# Patient Record
Sex: Female | Born: 1945
Health system: Southern US, Community
[De-identification: ages and names within clinical notes are randomized; demographics above are authoritative.]

## PROBLEM LIST (undated history)

## (undated) DIAGNOSIS — E079 Disorder of thyroid, unspecified: Secondary | ICD-10-CM

## (undated) DIAGNOSIS — K219 Gastro-esophageal reflux disease without esophagitis: Secondary | ICD-10-CM

## (undated) DIAGNOSIS — I10 Essential (primary) hypertension: Secondary | ICD-10-CM

## (undated) HISTORY — DX: Gastro-esophageal reflux disease without esophagitis: K21.9

## (undated) HISTORY — DX: Disorder of thyroid, unspecified: E07.9

## (undated) HISTORY — PX: DILATION AND CURETTAGE OF UTERUS: SHX78

## (undated) HISTORY — DX: Essential (primary) hypertension: I10

---

## 2015-01-29 ENCOUNTER — Ambulatory Visit (INDEPENDENT_AMBULATORY_CARE_PROVIDER_SITE_OTHER): Payer: Medicare Other | Admitting: Internal Medicine

## 2015-01-29 ENCOUNTER — Encounter: Payer: Self-pay | Admitting: Internal Medicine

## 2015-01-29 ENCOUNTER — Encounter: Payer: Self-pay | Admitting: *Deleted

## 2015-01-29 VITALS — BP 133/75 | HR 82 | Resp 16 | Ht 61.0 in | Wt 168.0 lb

## 2015-01-29 DIAGNOSIS — I10 Essential (primary) hypertension: Secondary | ICD-10-CM

## 2015-01-29 DIAGNOSIS — R011 Cardiac murmur, unspecified: Secondary | ICD-10-CM | POA: Insufficient documentation

## 2015-01-29 DIAGNOSIS — E038 Other specified hypothyroidism: Secondary | ICD-10-CM

## 2015-01-29 DIAGNOSIS — N6019 Diffuse cystic mastopathy of unspecified breast: Secondary | ICD-10-CM | POA: Insufficient documentation

## 2015-01-29 DIAGNOSIS — E039 Hypothyroidism, unspecified: Secondary | ICD-10-CM | POA: Insufficient documentation

## 2015-01-29 LAB — CBC WITH DIFFERENTIAL/PLATELET
BASOS PCT: 0 % (ref 0–1)
Basophils Absolute: 0 10*3/uL (ref 0.0–0.1)
EOS ABS: 0.1 10*3/uL (ref 0.0–0.7)
Eosinophils Relative: 1 % (ref 0–5)
HCT: 40.9 % (ref 36.0–46.0)
HEMOGLOBIN: 13.6 g/dL (ref 12.0–15.0)
LYMPHS PCT: 28 % (ref 12–46)
Lymphs Abs: 2.4 10*3/uL (ref 0.7–4.0)
MCH: 30 pg (ref 26.0–34.0)
MCHC: 33.3 g/dL (ref 30.0–36.0)
MCV: 90.3 fL (ref 78.0–100.0)
MONOS PCT: 9 % (ref 3–12)
MPV: 8.9 fL (ref 8.6–12.4)
Monocytes Absolute: 0.8 10*3/uL (ref 0.1–1.0)
NEUTROS ABS: 5.3 10*3/uL (ref 1.7–7.7)
NEUTROS PCT: 62 % (ref 43–77)
Platelets: 304 10*3/uL (ref 150–400)
RBC: 4.53 MIL/uL (ref 3.87–5.11)
RDW: 13.5 % (ref 11.5–15.5)
WBC: 8.5 10*3/uL (ref 4.0–10.5)

## 2015-01-29 LAB — COMPREHENSIVE METABOLIC PANEL
ALK PHOS: 75 U/L (ref 39–117)
ALT: 20 U/L (ref 0–35)
AST: 24 U/L (ref 0–37)
Albumin: 4.2 g/dL (ref 3.5–5.2)
BUN: 8 mg/dL (ref 6–23)
CO2: 26 mEq/L (ref 19–32)
CREATININE: 0.59 mg/dL (ref 0.50–1.10)
Calcium: 9.4 mg/dL (ref 8.4–10.5)
Chloride: 102 mEq/L (ref 96–112)
Glucose, Bld: 87 mg/dL (ref 70–99)
POTASSIUM: 4.3 meq/L (ref 3.5–5.3)
Sodium: 140 mEq/L (ref 135–145)
Total Bilirubin: 0.4 mg/dL (ref 0.2–1.2)
Total Protein: 6.8 g/dL (ref 6.0–8.3)

## 2015-01-29 LAB — LIPID PANEL
CHOL/HDL RATIO: 4 ratio
CHOLESTEROL: 177 mg/dL (ref 0–200)
HDL: 44 mg/dL (ref >39)
LDL Cholesterol: 115 mg/dL — ABNORMAL HIGH (ref 0–99)
Triglycerides: 92 mg/dL (ref <150)
VLDL: 18 mg/dL (ref 0–40)

## 2015-01-29 NOTE — Progress Notes (Addendum)
   Subjective:    Patient ID: Tracy Barber, female    DOB: 1946-08-25, 69 y.o.   MRN: [id number]  HPI New pt here for first visit  PMH of  HTN, GERD,  Heart murmur  (Dr. Judithe ModestFolk),  Hypothyroidism  (Dr. Allena KatzPatel) and Fibrocystic breast disease.    Doing well   Has occasional palpitations controlled with beta blocker   Allergies not on file No past medical history on file. No past surgical history on file. History   Social History  . Marital Status: N/A    Spouse Name: N/A    Number of Children: N/A  . Years of Education: N/A   Occupational History  . Not on file.   Social History Main Topics  . Smoking status: Not on file  . Smokeless tobacco: Not on file  . Alcohol Use: Not on file  . Drug Use: Not on file  . Sexual Activity: Not on file   Other Topics Concern  . Not on file   Social History Narrative  . No narrative on file   No family history on file. There are no active problems to display for this patient.  No current outpatient prescriptions on file prior to visit.   No current facility-administered medications on file prior to visit.       Review of Systems See HPI    Objective:   Physical Exam Physical Exam  Nursing note and vitals reviewed.  Constitutional: She is oriented to person, place, and time. She appears well-developed and well-nourished.  HENT:  Head: Normocephalic and atraumatic.  Cardiovascular: Normal rate and regular rhythm. Exam reveals no gallop and no friction rub.  2/6  SEM    Pulmonary/Chest: Breath sounds normal. She has no wheezes. She has no rales.  Neurological: She is alert and oriented to person, place, and time.  Skin: Skin is warm and dry.  No edema  Psychiatric: She has a normal mood and affect. Her behavior is normal.         Assessment & Plan:  HTN continue beta blocker  Will check all labs today  GERD continue omeprazole  hypothyrodism  Continue meds  fiborcystic breast disease  Check all labs   Schedule CPE  Addendum  3/1  Review of old record  PMH of renal calculus,  Diverticulosis and was referred to GYN for perineal lesion R side.  Seen 08/2014  By FNP Deneen Hartselizabeth [facility]

## 2015-01-30 ENCOUNTER — Encounter: Payer: Self-pay | Admitting: *Deleted

## 2015-01-30 LAB — VITAMIN D 25 HYDROXY (VIT D DEFICIENCY, FRACTURES): Vit D, 25-Hydroxy: 48 ng/mL (ref 30–100)

## 2015-01-30 LAB — TSH: TSH: 0.555 u[IU]/mL (ref 0.350–4.500)

## 2015-02-25 ENCOUNTER — Encounter: Payer: Self-pay | Admitting: Internal Medicine

## 2015-02-25 ENCOUNTER — Encounter: Payer: Self-pay | Admitting: *Deleted

## 2015-02-25 DIAGNOSIS — K5730 Diverticulosis of large intestine without perforation or abscess without bleeding: Secondary | ICD-10-CM | POA: Insufficient documentation

## 2015-02-25 DIAGNOSIS — N200 Calculus of kidney: Secondary | ICD-10-CM | POA: Insufficient documentation

## 2015-06-04 ENCOUNTER — Encounter: Payer: Medicare Other | Admitting: Internal Medicine

## 2016-09-04 ENCOUNTER — Emergency Department (HOSPITAL_BASED_OUTPATIENT_CLINIC_OR_DEPARTMENT_OTHER)
Admission: EM | Admit: 2016-09-04 | Discharge: 2016-09-04 | Disposition: A | Discharge status: Home / Self Care | Payer: Medicare Other | Attending: Emergency Medicine | Admitting: Emergency Medicine

## 2016-09-04 ENCOUNTER — Encounter (HOSPITAL_BASED_OUTPATIENT_CLINIC_OR_DEPARTMENT_OTHER): Payer: Self-pay | Admitting: Emergency Medicine

## 2016-09-04 DIAGNOSIS — Z23 Encounter for immunization: Secondary | ICD-10-CM | POA: Diagnosis not present

## 2016-09-04 DIAGNOSIS — S61412A Laceration without foreign body of left hand, initial encounter: Secondary | ICD-10-CM | POA: Insufficient documentation

## 2016-09-04 DIAGNOSIS — W260XXA Contact with knife, initial encounter: Secondary | ICD-10-CM | POA: Diagnosis not present

## 2016-09-04 DIAGNOSIS — Y999 Unspecified external cause status: Secondary | ICD-10-CM | POA: Insufficient documentation

## 2016-09-04 DIAGNOSIS — Z79899 Other long term (current) drug therapy: Secondary | ICD-10-CM | POA: Insufficient documentation

## 2016-09-04 DIAGNOSIS — I10 Essential (primary) hypertension: Secondary | ICD-10-CM | POA: Insufficient documentation

## 2016-09-04 DIAGNOSIS — Y939 Activity, unspecified: Secondary | ICD-10-CM | POA: Insufficient documentation

## 2016-09-04 DIAGNOSIS — Y929 Unspecified place or not applicable: Secondary | ICD-10-CM | POA: Diagnosis not present

## 2016-09-04 MED ORDER — TETANUS-DIPHTH-ACELL PERTUSSIS 5-2.5-18.5 LF-MCG/0.5 IM SUSP
0.5000 mL | Freq: Once | INTRAMUSCULAR | Status: AC
  Administered 2016-09-04: 0.5 mL via INTRAMUSCULAR
  Filled 2016-09-04: qty 0.5

## 2016-09-04 NOTE — ED Triage Notes (Signed)
Patient states she was slicing an avocado and cut the palm of her hand.

## 2016-09-04 NOTE — ED Provider Notes (Signed)
[facility]   CSN: [id number] Arrival date & time: 09/04/16  1106     History   Chief Complaint Chief Complaint  Patient presents with  . Laceration    left hand    HPI Tracy Barber is a 70 y.o. female.  The history is provided by the patient. No language interpreter was used.  Laceration   The incident occurred less than 1 hour ago. The laceration is located on the left hand. The laceration is 1 cm in size. The laceration mechanism was a a clean knife. The pain is mild. The pain has been constant since onset. She reports no foreign bodies present. Her tetanus status is out of date.  Pt cut finger with a knife trying to cut an avacado.  Pt reports she stabbed left hand with knife  Past Medical History:  Diagnosis Date  . GERD (gastroesophageal reflux disease)   . Hypertension   . Thyroid disease     Patient Active Problem List   Diagnosis Date Noted  . Calculus of kidney 02/25/2015  . Diverticulosis of colon without hemorrhage 02/25/2015  . Fibrocystic breast disease 01/29/2015  . Hypothyroidism 01/29/2015  . Heart murmur 01/29/2015    Past Surgical History:  Procedure Laterality Date  . CESAREAN SECTION    . DILATION AND CURETTAGE OF UTERUS      OB History    No data available       Home Medications    Prior to Admission medications   Medication Sig Start Date End Date Taking? Authorizing Provider  levothyroxine (SYNTHROID, LEVOTHROID) 100 MCG tablet  01/27/15  Yes Historical Provider, MD  metoprolol succinate (TOPROL-XL) 50 MG 24 hr tablet  11/29/14  Yes Historical Provider, MD  omeprazole (PRILOSEC) 40 MG capsule  01/16/15  Yes Historical Provider, MD    Family History Family History  Problem Relation Age of Onset  . COPD Mother   . Stroke Father   . Heart disease Father   . Hypertension Father   . Thyroid disease Sister   . Hypertension Sister   . Colon cancer Paternal Aunt   . Breast cancer Cousin     Social  History Social History  Substance Use Topics  . Smoking status: Never Smoker  . Smokeless tobacco: Never Used  . Alcohol use 0.0 oz/week     Comment: rare     Allergies   Biaxin [clarithromycin] and Codeine   Review of Systems Review of Systems  All other systems reviewed and are negative.    Physical Exam Updated Vital Signs BP 172/92 (BP Location: Right Arm)   Pulse 88   Temp 98.5 F (36.9 C) (Oral)   Resp 18   Ht 5\' 2"  (1.575 m)   Wt 72.6 kg   SpO2 100%   BMI 29.26 kg/m   Physical Exam  Constitutional: She appears well-developed and well-nourished. No distress.  HENT:  Head: Normocephalic and atraumatic.  Eyes: Conjunctivae are normal.  Cardiovascular: Normal rate.   No murmur heard. Pulmonary/Chest: Effort normal. No respiratory distress.  Abdominal: Soft. There is no tenderness.  Musculoskeletal: She exhibits no edema.  1cm laceration palmar aspect of left hand,  No gapping,  Minimal penetration,  nv and ns intact  Neurological: She is alert.  Skin: Skin is warm and dry.  Psychiatric: She has a normal mood and affect.  Nursing note and vitals reviewed.    ED Treatments / Results  Labs (all labs ordered are listed, but only abnormal  results are displayed) Labs Reviewed - No data to display  EKG  EKG Interpretation None       Radiology No results found.  Procedures Procedures (including critical care time)  Medications Ordered in ED Medications  Tdap (BOOSTRIX) injection 0.5 mL (not administered)     Initial Impression / Assessment and Plan / ED Course  I have reviewed the triage vital signs and the nursing notes.  Pertinent labs & imaging results that were available during my care of the patient were reviewed by me and considered in my medical decision making (see chart for details).  Clinical Course    Wound is superficial,   Steri strips to close area,  Tetanus given bandage.  Final Clinical Impressions(s) / ED Diagnoses    Final diagnoses:  Laceration of hand, left, initial encounter    New Prescriptions New Prescriptions   No medications on file  An After Visit Summary was printed and given to the patient.   Lonia Skinner [place], PA-C 09/04/16 1140    Jacalyn Lefevre, MD 09/04/16 [phone]

## 2016-09-04 NOTE — ED Notes (Signed)
Patient stabbed herself in left palm while pitting an avocado approximately 1.5 hrs ago.  Gauze wrap removed by PA for inspection.  Not actively bleeding and patient demonstrated full movement of her hand.

## 2017-12-19 ENCOUNTER — Encounter (HOSPITAL_BASED_OUTPATIENT_CLINIC_OR_DEPARTMENT_OTHER): Payer: Self-pay | Admitting: Emergency Medicine

## 2017-12-19 ENCOUNTER — Emergency Department (HOSPITAL_BASED_OUTPATIENT_CLINIC_OR_DEPARTMENT_OTHER): Payer: Medicare Other

## 2017-12-19 ENCOUNTER — Other Ambulatory Visit: Payer: Self-pay

## 2017-12-19 ENCOUNTER — Emergency Department (HOSPITAL_BASED_OUTPATIENT_CLINIC_OR_DEPARTMENT_OTHER)
Admission: EM | Admit: 2017-12-19 | Discharge: 2017-12-19 | Disposition: A | Discharge status: Home / Self Care | Payer: Medicare Other | Attending: Emergency Medicine | Admitting: Emergency Medicine

## 2017-12-19 DIAGNOSIS — I10 Essential (primary) hypertension: Secondary | ICD-10-CM | POA: Diagnosis not present

## 2017-12-19 DIAGNOSIS — Z79899 Other long term (current) drug therapy: Secondary | ICD-10-CM | POA: Insufficient documentation

## 2017-12-19 DIAGNOSIS — M79602 Pain in left arm: Secondary | ICD-10-CM | POA: Insufficient documentation

## 2017-12-19 DIAGNOSIS — M25512 Pain in left shoulder: Secondary | ICD-10-CM | POA: Insufficient documentation

## 2017-12-19 DIAGNOSIS — R011 Cardiac murmur, unspecified: Secondary | ICD-10-CM | POA: Diagnosis not present

## 2017-12-19 LAB — BASIC METABOLIC PANEL
Anion gap: 6 (ref 5–15)
BUN: 9 mg/dL (ref 6–20)
CHLORIDE: 107 mmol/L (ref 101–111)
CO2: 24 mmol/L (ref 22–32)
CREATININE: 0.6 mg/dL (ref 0.44–1.00)
Calcium: 8.8 mg/dL — ABNORMAL LOW (ref 8.9–10.3)
Glucose, Bld: 120 mg/dL — ABNORMAL HIGH (ref 65–99)
POTASSIUM: 3.6 mmol/L (ref 3.5–5.1)
SODIUM: 137 mmol/L (ref 135–145)

## 2017-12-19 LAB — CBC WITH DIFFERENTIAL/PLATELET
BASOS ABS: 0 10*3/uL (ref 0.0–0.1)
BASOS PCT: 0 %
EOS ABS: 0 10*3/uL (ref 0.0–0.7)
Eosinophils Relative: 1 %
HEMATOCRIT: 42.6 % (ref 36.0–46.0)
HEMOGLOBIN: 14.2 g/dL (ref 12.0–15.0)
Lymphocytes Relative: 24 %
Lymphs Abs: 1.7 10*3/uL (ref 0.7–4.0)
MCH: 29.6 pg (ref 26.0–34.0)
MCHC: 33.3 g/dL (ref 30.0–36.0)
MCV: 88.9 fL (ref 78.0–100.0)
Monocytes Absolute: 0.6 10*3/uL (ref 0.1–1.0)
Monocytes Relative: 9 %
NEUTROS ABS: 4.6 10*3/uL (ref 1.7–7.7)
NEUTROS PCT: 66 %
Platelets: 286 10*3/uL (ref 150–400)
RBC: 4.79 MIL/uL (ref 3.87–5.11)
RDW: 13.3 % (ref 11.5–15.5)
WBC: 7 10*3/uL (ref 4.0–10.5)

## 2017-12-19 LAB — TROPONIN I

## 2017-12-19 MED ORDER — PROMETHAZINE HCL 25 MG PO TABS: 25.0000 mg | ORAL_TABLET | Freq: Four times a day (QID) | ORAL | 0 refills | Status: AC | PRN

## 2017-12-19 MED ORDER — HYDROCODONE-ACETAMINOPHEN 5-325 MG PO TABS
1.0000 | ORAL_TABLET | Freq: Once | ORAL | Status: DC
  Filled 2017-12-19: qty 1

## 2017-12-19 MED ORDER — HYDROCODONE-ACETAMINOPHEN 5-325 MG PO TABS: 1.0000 | ORAL_TABLET | Freq: Four times a day (QID) | ORAL | 0 refills | Status: AC | PRN

## 2017-12-19 MED FILL — HYDROCODON-APAP 5-325: 5-325 | 2 days supply | Qty: 6 | Fill #0

## 2017-12-19 MED FILL — PROMETHAZINE 25 MG TABLET: 25 | 8 days supply | Qty: 30 | Fill #0

## 2017-12-19 NOTE — ED Notes (Signed)
Patient transported to X-ray 

## 2017-12-19 NOTE — Discharge Instructions (Signed)
Your heart work-up today is reassuring. Your x-rays of the shoulder and chest are normal. This may be more muscular pain or strain. Continue ibuprofen/motrin. Norco is prescribed for break through pain for the next 1-2 days. Please follow-up with your PCP  Return for worsening symptoms, including difficulty breathing, escalating pain, passing out, extreme fatigue or any other symptoms concerning to you

## 2017-12-19 NOTE — ED Provider Notes (Signed)
[facility] EMERGENCY DEPARTMENT Provider Note   CSN: [id number] Arrival date & time: 12/19/17  1013     History   Chief Complaint Chief Complaint  Patient presents with  . Arm Pain    HPI Tracy Barber is a 71 y.o. female.  HPI 71 year old female who presents with left arm pain.  She has a history of hypertension and hypothyroidism.  Reports several days of constant left upper arm, left shoulder blade, and left shoulder pain.  Her symptoms have been ongoing for 1 week, constant, unremitting.  Symptoms sometimes worse with range of motion of the left shoulder.  Not associated with activity or exertion.  Denies associating shortness of breath, nausea or vomiting or diaphoresis, syncope or near syncope.  Does report doing yoga on a routine basis but does not recall a specific injury.  Also states that one week ago she was shoveling snow.  Has taking 800 mg of ibuprofen occasionally without significant improvement in pain.  Denies arm swelling or leg swelling.  She reports that her friend who is a Publishing rights managernurse practitioner told her to come to the ED to be ruled out for heart attack as she was told that women present atypically with heart problems.   Past Medical History:  Diagnosis Date  . GERD (gastroesophageal reflux disease)   . Hypertension   . Thyroid disease     Patient Active Problem List   Diagnosis Date Noted  . Calculus of kidney 02/25/2015  . Diverticulosis of colon without hemorrhage 02/25/2015  . Fibrocystic breast disease 01/29/2015  . Hypothyroidism 01/29/2015  . Heart murmur 01/29/2015    Past Surgical History:  Procedure Laterality Date  . CESAREAN SECTION    . DILATION AND CURETTAGE OF UTERUS      OB History    No data available       Home Medications    Prior to Admission medications   Medication Sig Start Date End Date Taking? Authorizing Provider  HYDROcodone-acetaminophen (NORCO/VICODIN) 5-325 MG tablet Take 1 tablet by mouth every 6  (six) hours as needed for severe pain. 12/19/17   Lavera GuiseLiu, Tabytha Gradillas Duo, MD  levothyroxine Erline Levine(SYNTHROID, LEVOTHROID) 100 MCG tablet  01/27/15   [provider]  metoprolol succinate (TOPROL-XL) 50 MG 24 hr tablet  11/29/14   [provider]  omeprazole (PRILOSEC) 40 MG capsule  01/16/15   [provider]  promethazine (PHENERGAN) 25 MG tablet Take 1 tablet (25 mg total) by mouth every 6 (six) hours as needed for nausea or vomiting. 12/19/17   Lavera GuiseLiu, Didier Brandenburg Duo, MD    Family History Family History  Problem Relation Age of Onset  . COPD Mother   . Stroke Father   . Heart disease Father   . Hypertension Father   . Thyroid disease Sister   . Hypertension Sister   . Colon cancer Paternal Aunt   . Breast cancer Cousin     Social History Social History   Tobacco Use  . Smoking status: Never Smoker  . Smokeless tobacco: Never Used  Substance Use Topics  . Alcohol use: Yes    Alcohol/week: 0.0 oz    Comment: rare  . Drug use: No     Allergies   Biaxin [clarithromycin] and Codeine   Review of Systems Review of Systems  Constitutional: Negative for fever.  Respiratory: Negative for shortness of breath.   Cardiovascular: Negative for chest pain.  Musculoskeletal:       Left arm shoulder pain   Allergic/Immunologic: Negative  for immunocompromised state.  Hematological: Does not bruise/bleed easily.  All other systems reviewed and are negative.    Physical Exam Updated Vital Signs BP (!) 187/82 (BP Location: Right Arm)   Pulse (!) 102   Temp 98.4 F (36.9 C) (Oral)   Resp 18   SpO2 99%   Physical Exam Physical Exam  Nursing note and vitals reviewed. Constitutional: Well developed, well nourished, non-toxic, and in no acute distress Head: Normocephalic and atraumatic.  Mouth/Throat: Oropharynx is clear and moist.  Neck: Normal range of motion. Neck supple.  Cardiovascular: Normal rate and regular rhythm.  +2 DP pulses  Pulmonary/Chest: Effort normal and  breath sounds normal.  Abdominal: Soft. There is no tenderness. There is no rebound and no guarding.  Musculoskeletal: Normal range of motion, with some pain when shoulder is abducted.  No muscular tenderness to palpation.  Neurological: Alert, no facial droop, fluent speech, moves all extremities symmetrically, full strength and sensation bilateral upper extremities Skin: Skin is warm and dry.  Psychiatric: Cooperative   ED Treatments / Results  Labs (all labs ordered are listed, but only abnormal results are displayed) Labs Reviewed  BASIC METABOLIC PANEL - Abnormal; Notable for the following components:      Result Value   Glucose, Bld 120 (*)    Calcium 8.8 (*)    All other components within normal limits  CBC WITH DIFFERENTIAL/PLATELET  TROPONIN I    EKG  EKG Interpretation  Date/Time:  Monday December 19 2017 10:53:52 EST Ventricular Rate:  85 PR Interval:    QRS Duration: 105 QT Interval:  379 QTC Calculation: 451 R Axis:   31 Text Interpretation:  Sinus rhythm no ischemic findings no prior EKG  Confirmed by Crista Curb [phone]) on 12/19/2017 11:28:14 AM       Radiology Dg Chest 2 View  Result Date: 12/19/2017 CLINICAL DATA:  Left upper extremity throbbing for several days. EXAM: CHEST  2 VIEW COMPARISON:  None FINDINGS: Mild atelectasis or scar in the left lung base. No pneumothorax. The heart, hila, mediastinum, lungs, and pleura are otherwise normal. IMPRESSION: No active cardiopulmonary disease. Electronically Signed   By: Gerome Sam III M.D   On: 12/19/2017 10:49   Dg Shoulder Left  Result Date: 12/19/2017 CLINICAL DATA:  Pain. EXAM: LEFT SHOULDER - 2+ VIEW COMPARISON:  None. FINDINGS: There is no evidence of fracture or dislocation. There is no evidence of arthropathy or other focal bone abnormality. Soft tissues are unremarkable. IMPRESSION: Negative. Electronically Signed   By: Gerome Sam III M.D   On: 12/19/2017 10:50    Procedures Procedures  (including critical care time)  Medications Ordered in ED Medications  HYDROcodone-acetaminophen (NORCO/VICODIN) 5-325 MG per tablet 1 tablet (1 tablet Oral Refused 12/19/17 1054)     Initial Impression / Assessment and Plan / ED Course  I have reviewed the triage vital signs and the nursing notes.  Pertinent labs & imaging results that were available during my care of the patient were reviewed by me and considered in my medical decision making (see chart for details).     71 year old female who presents with left arm pain.  She is nontoxic in no acute distress with stable vital signs.  Ongoing symptoms for 1 week now, constant.  Very atypical for ACS.  Somewhat reproduced with range of motion of the left shoulder.  Suspect likely more muscular strain or musculoskeletal pain.  Doubt serious processes such as dissection.  Extremity is neurovascularly intact.  Given her  concerns for cardiac etiology she did undergo blood work x-ray imaging and EKG.  Her EKG shows no acute ischemic changes.  X-rays of the shoulder and chest are visualized and shows no acute processes.  Blood work including troponin is normal.  She will be discharged home with continued supportive care instructions and follow-up with PCP. Strict return and follow-up instructions reviewed. She expressed understanding of all discharge instructions and felt comfortable with the plan of care.   Final Clinical Impressions(s) / ED Diagnoses   Final diagnoses:  Left arm pain    ED Discharge Orders        Ordered    HYDROcodone-acetaminophen (NORCO/VICODIN) 5-325 MG tablet  Every 6 hours PRN     12/19/17 1136    promethazine (PHENERGAN) 25 MG tablet  Every 6 hours PRN     12/19/17 1136       Lavera GuiseLiu, Antavion Bartoszek Duo, MD 12/19/17 1140

## 2017-12-19 NOTE — ED Triage Notes (Signed)
Pt c/o LUE "throbbing" for several days; c/o back pain and "shoulder blade" pain x 1 wk; thinks could be r/t doing yoga

## 2019-01-16 IMAGING — CR DG SHOULDER 2+V*L*
4 series · 4 of 4 positions shown · non-contrast
Comparison: None.

CLINICAL DATA: Pain.

EXAM:
LEFT SHOULDER - 2+ VIEW

[w shoulder grashey left (1 of 2)]
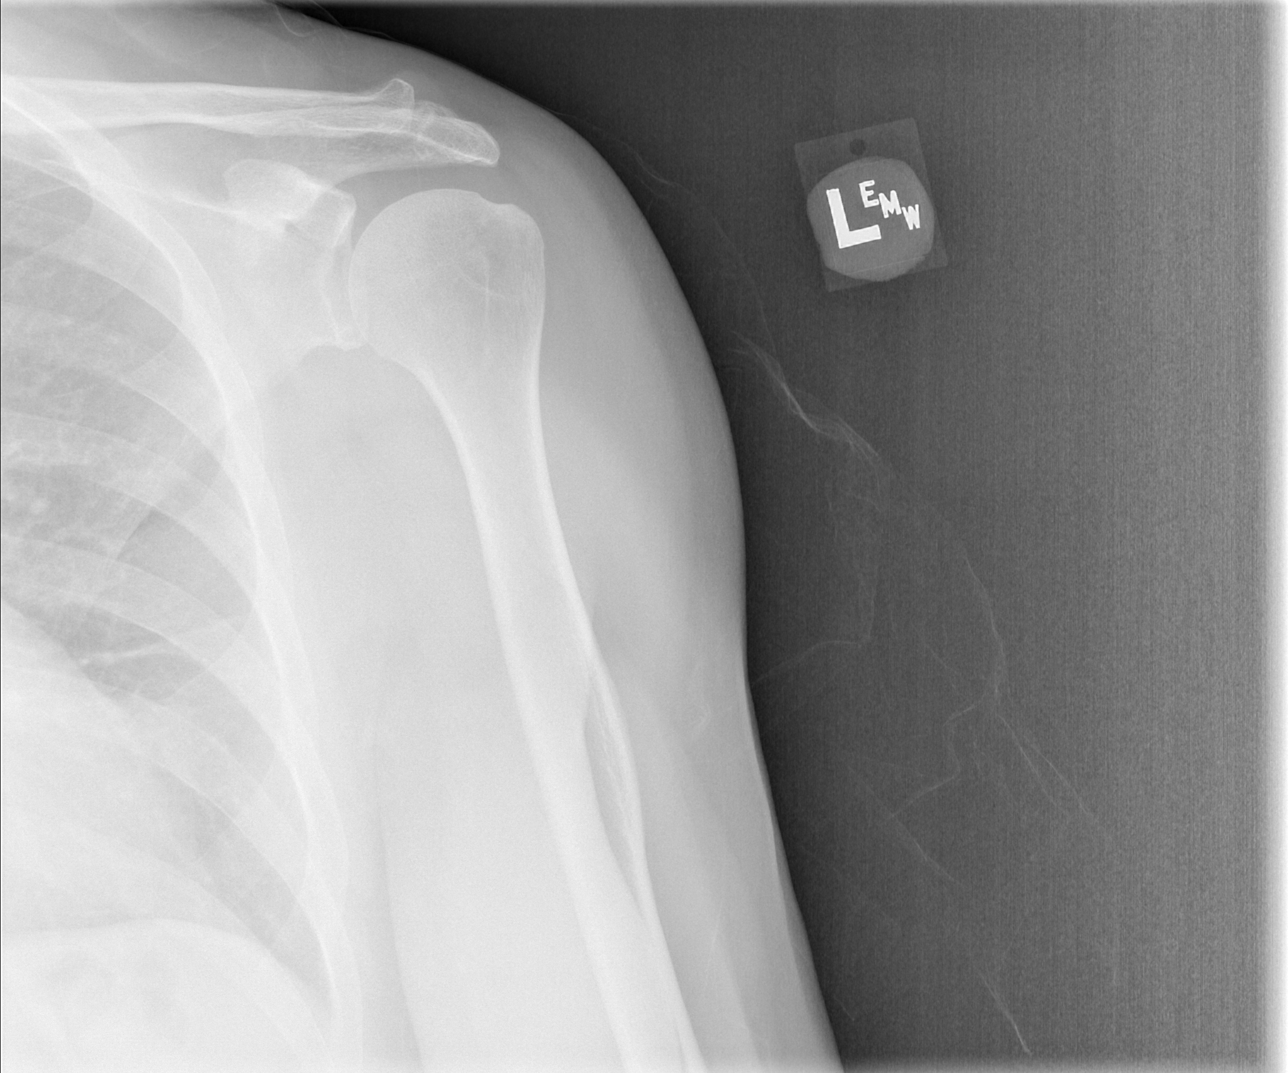

[w shoulder grashey left (2 of 2)]
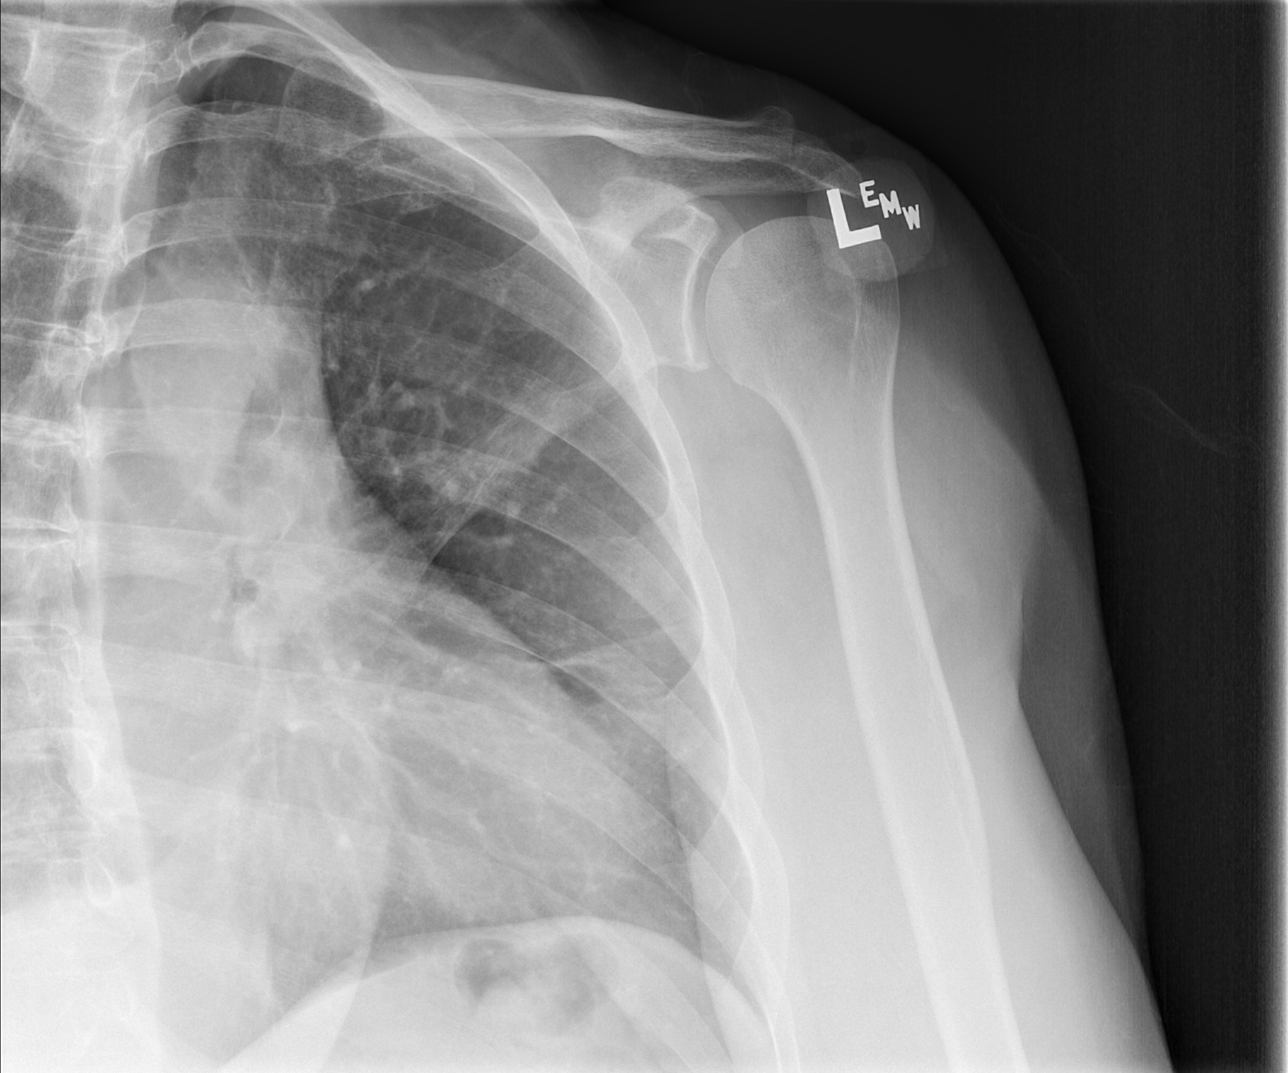

[w shoulder y view left]
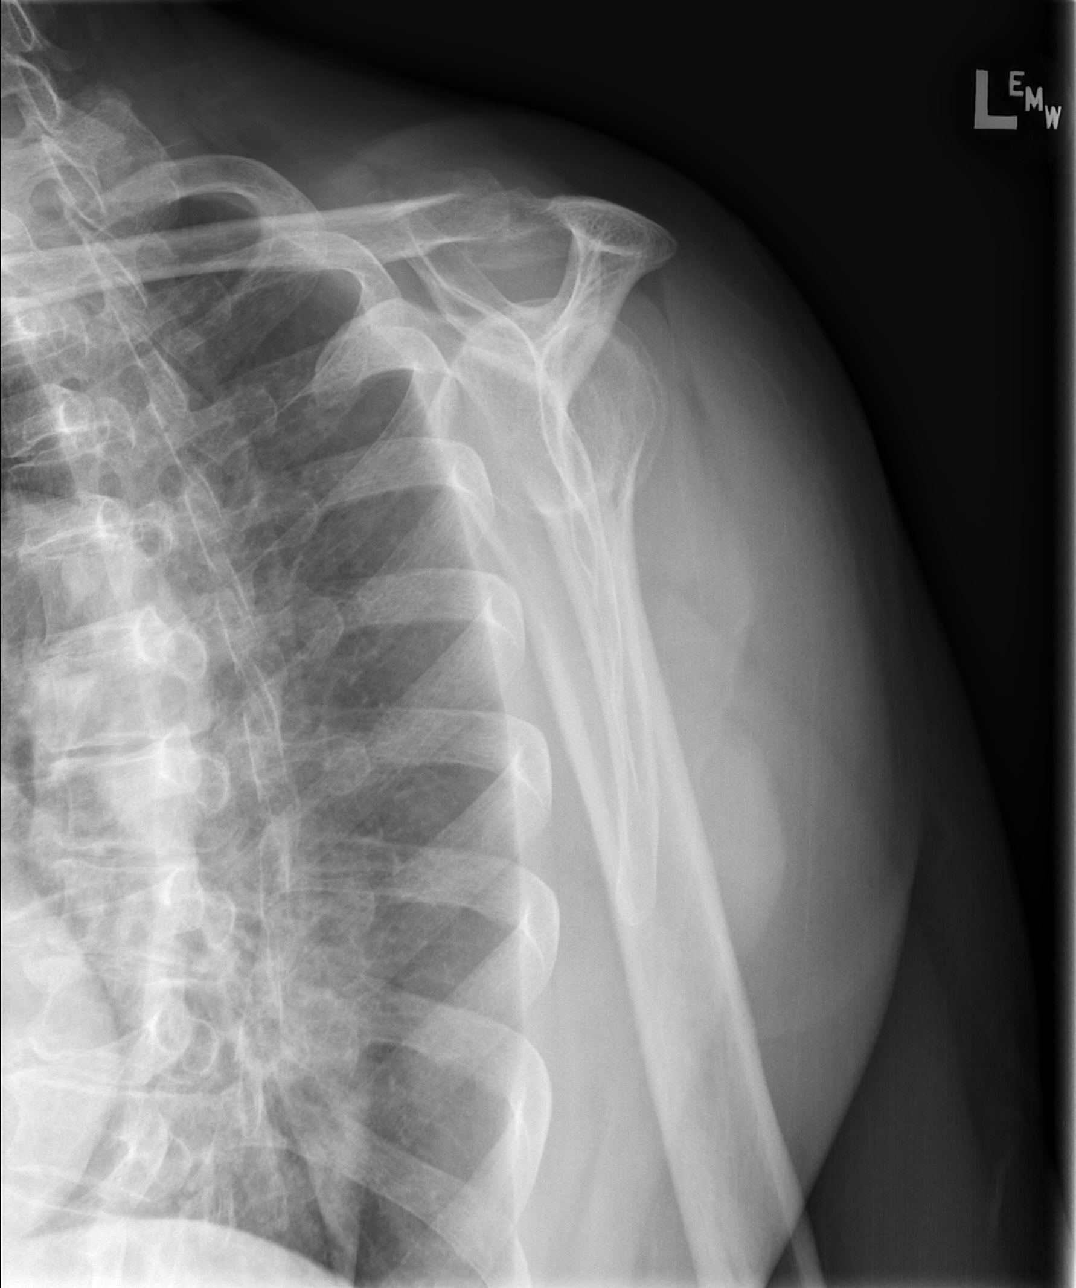

[x shoulder axillary left]
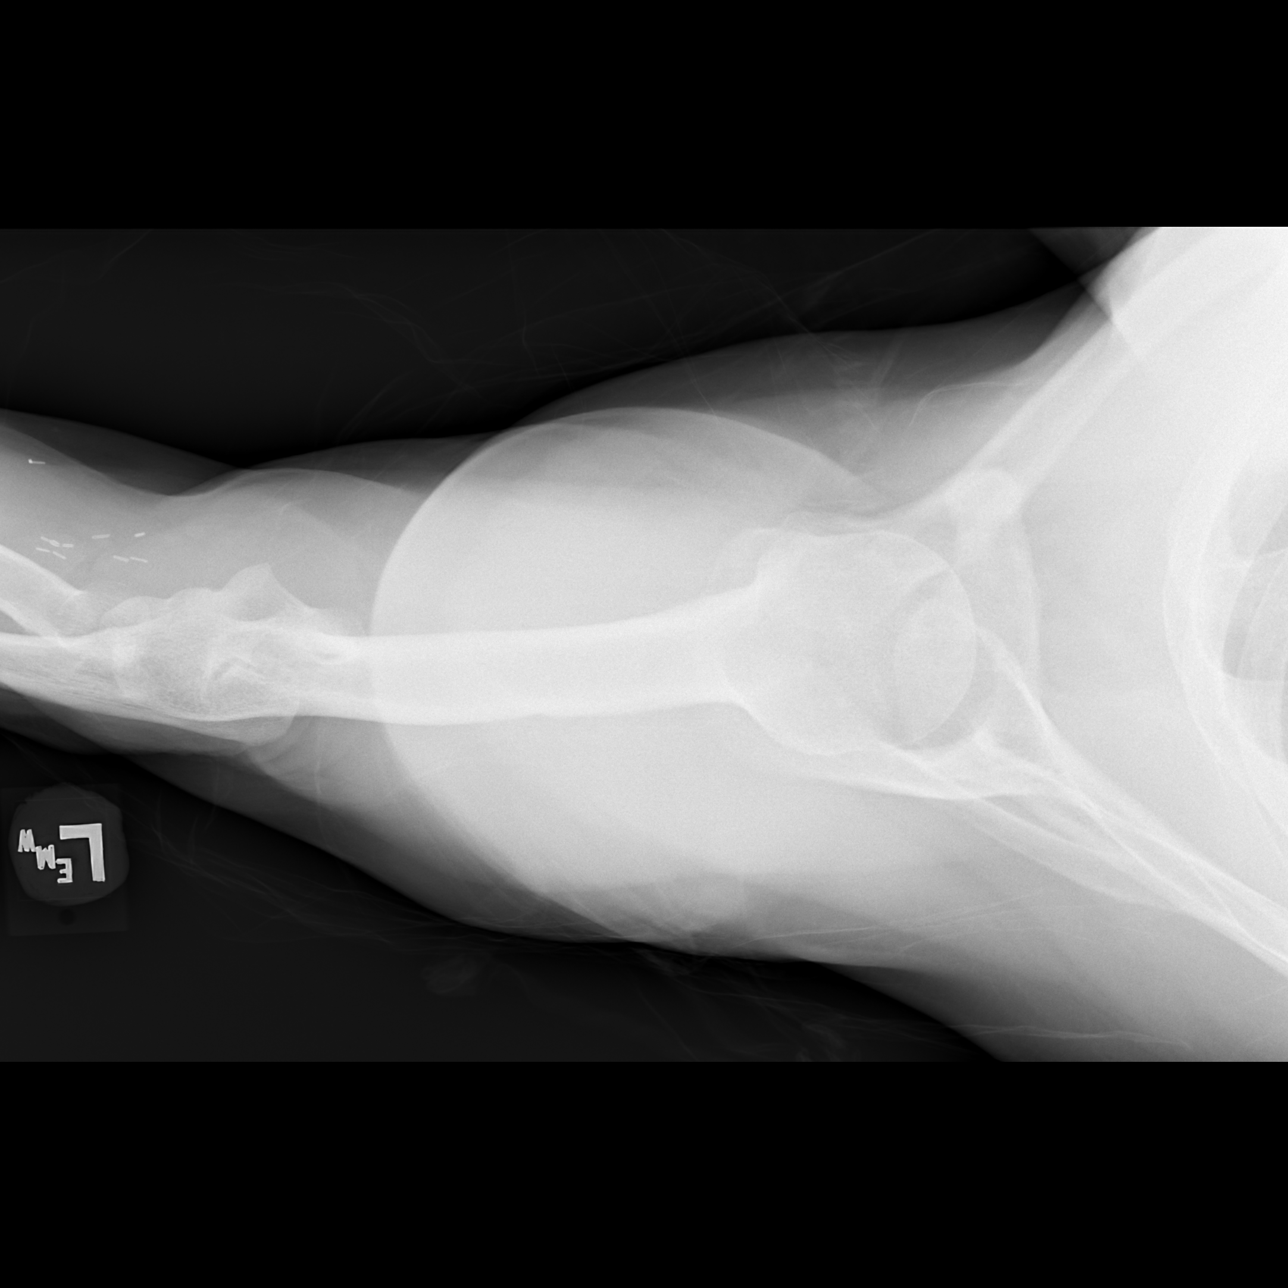

[4 of 4 positions shown; findings below may reference images not displayed]

FINDINGS: There is no evidence of fracture or dislocation. There is no
evidence of arthropathy or other focal bone abnormality. Soft
tissues are unremarkable.
IMPRESSION: Negative.

## 2019-01-16 IMAGING — CR DG CHEST 2V
2 series · 2 of 2 positions shown · non-contrast
Comparison: None

CLINICAL DATA: Left upper extremity throbbing for several days.

EXAM:
CHEST  2 VIEW

[w chest pa]
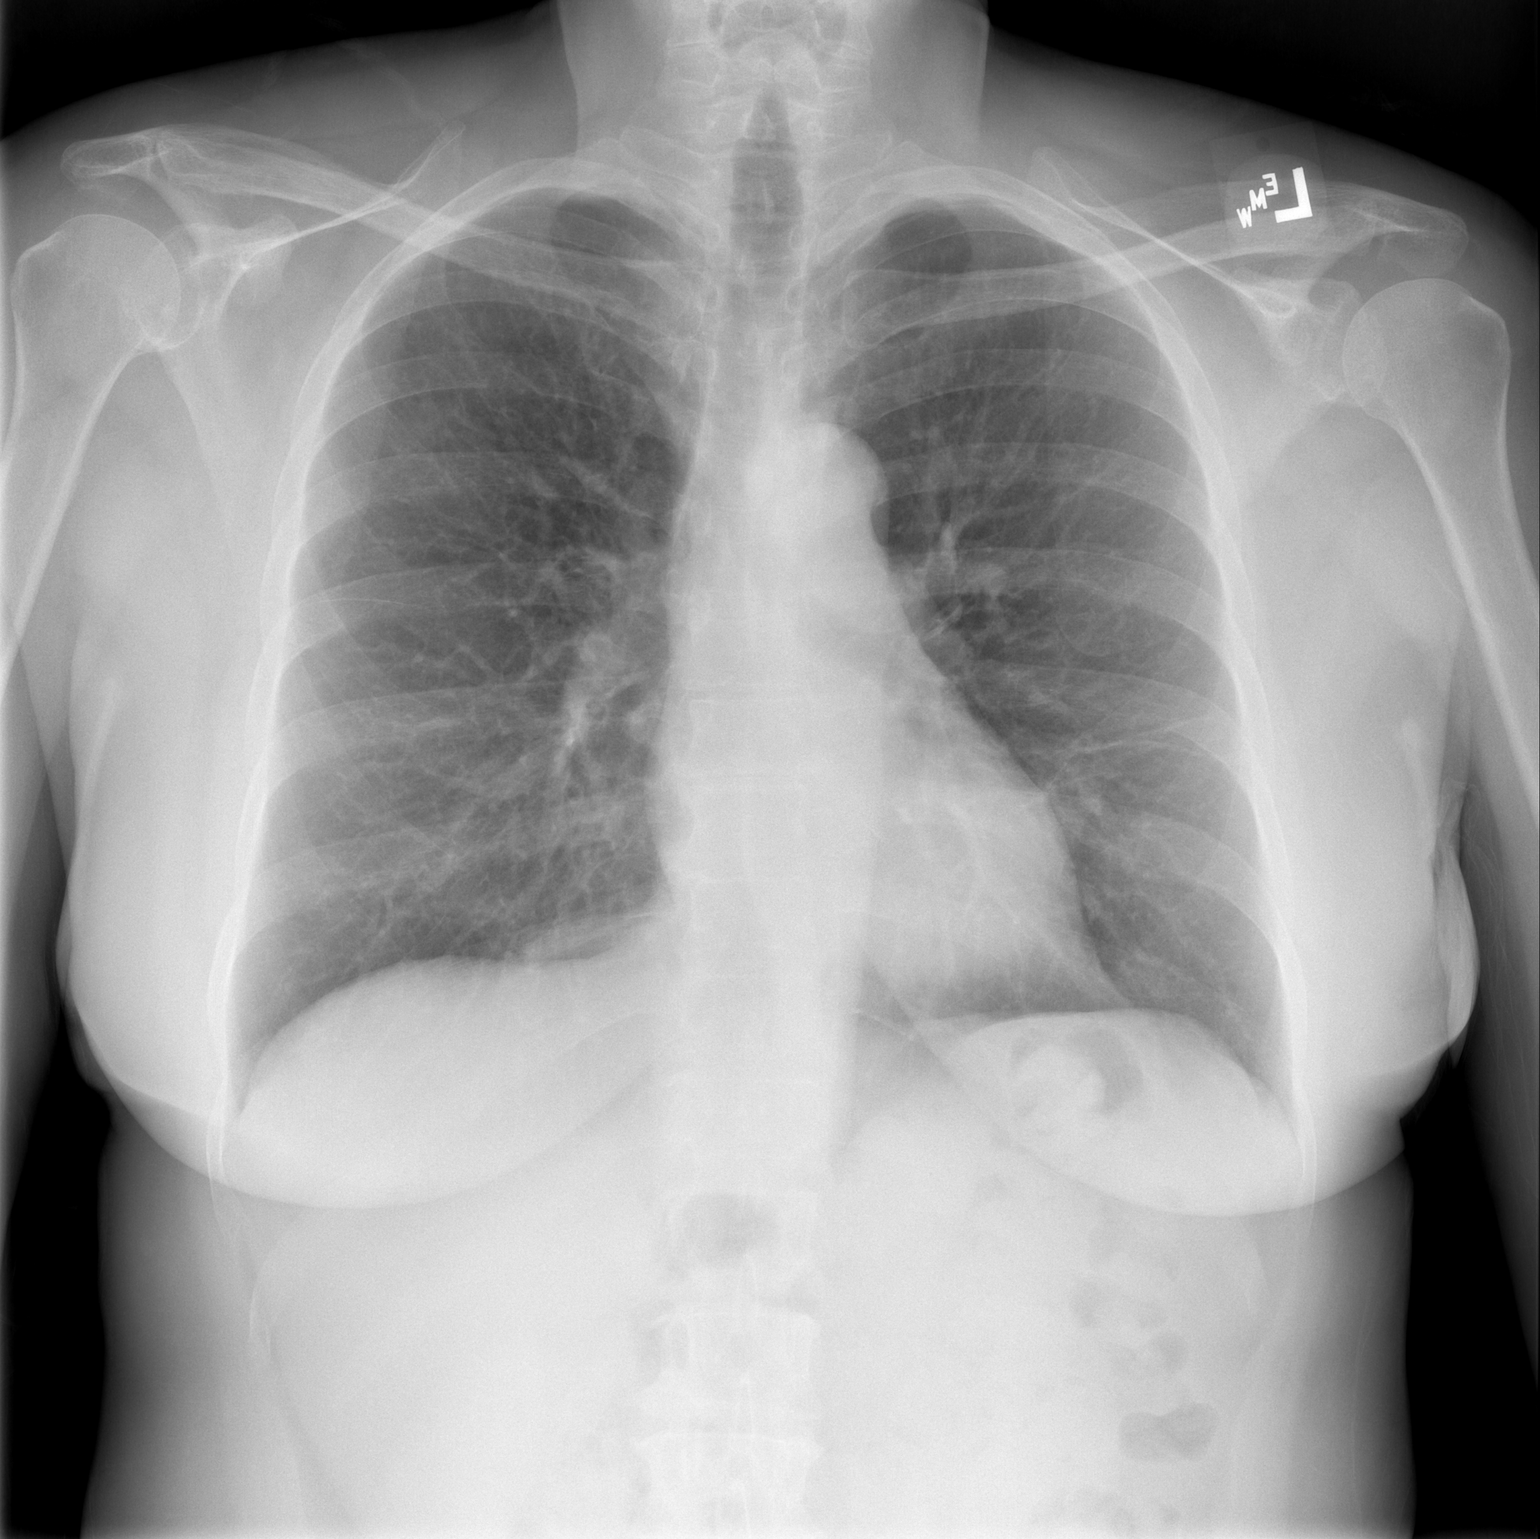

[w chest lat]
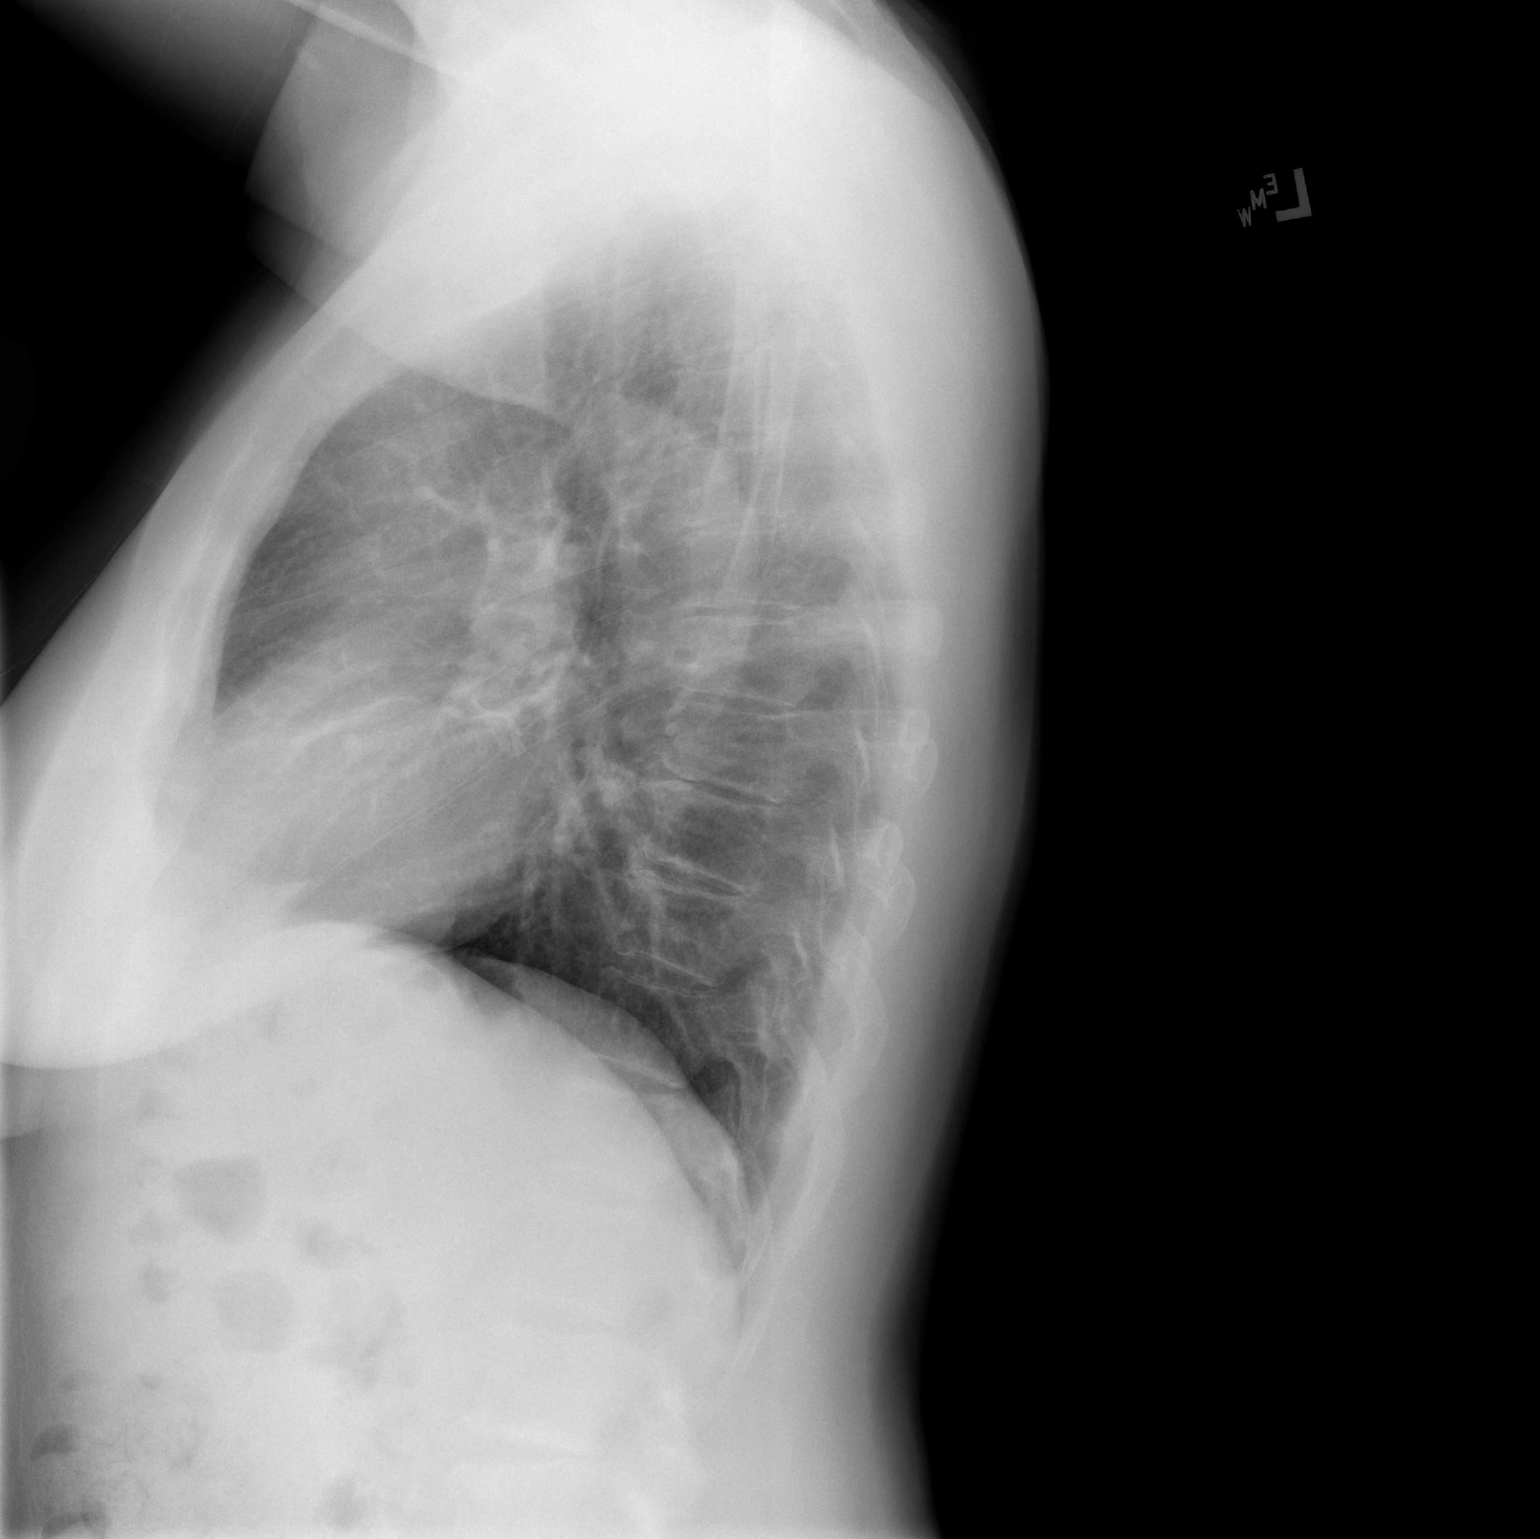

[2 of 2 positions shown; findings below may reference images not displayed]

FINDINGS: Mild atelectasis or scar in the left lung base. No pneumothorax. The
heart, hila, mediastinum, lungs, and pleura are otherwise normal.
IMPRESSION: No active cardiopulmonary disease.

## 2024-12-31 ENCOUNTER — Other Ambulatory Visit (HOSPITAL_COMMUNITY): Payer: Self-pay

## 2024-12-31 ENCOUNTER — Other Ambulatory Visit: Payer: Self-pay

## 2025-01-01 ENCOUNTER — Other Ambulatory Visit (HOSPITAL_COMMUNITY): Payer: Self-pay

## 2025-01-01 MED ORDER — LETROZOLE 2.5 MG PO TABS: 2.5000 mg | ORAL_TABLET | Freq: Every day | ORAL | 2 refills | Status: AC

## 2025-01-01 MED ORDER — METOPROLOL SUCCINATE ER 25 MG PO TB24: 25.0000 mg | ORAL_TABLET | Freq: Every day | ORAL | 3 refills | Status: AC
